# Patient Record
Sex: Male | Born: 1990 | Race: Black or African American | Hispanic: No | Marital: Married | Smoking: Never smoker
Health system: Southern US, Community
[De-identification: ages and names within clinical notes are randomized; demographics above are authoritative.]

---

## 2014-08-02 ENCOUNTER — Encounter (HOSPITAL_COMMUNITY): Payer: Self-pay | Admitting: Emergency Medicine

## 2014-08-02 ENCOUNTER — Emergency Department (HOSPITAL_COMMUNITY)
Admission: EM | Admit: 2014-08-02 | Discharge: 2014-08-02 | Disposition: A | Discharge status: Home / Self Care | Attending: Emergency Medicine | Admitting: Emergency Medicine

## 2014-08-02 DIAGNOSIS — R361 Hematospermia: Secondary | ICD-10-CM

## 2014-08-02 DIAGNOSIS — N4889 Other specified disorders of penis: Secondary | ICD-10-CM | POA: Diagnosis present

## 2014-08-02 DIAGNOSIS — R110 Nausea: Secondary | ICD-10-CM | POA: Insufficient documentation

## 2014-08-02 NOTE — ED Provider Notes (Signed)
CSN: [id number]     Arrival date & time 08/02/14  1900 History  This chart was scribed for non-physician practitioner, Roxy Horsemanobert Wen Munford, working with Azalia BilisKevin Campos, MD by Richarda Overlieichard Holland, ED Scribe. This patient was seen in room WTR8/WTR8 and the patient's care was started at 7:37 PM.   Chief Complaint  Patient presents with  . Penis Problem    HPI HPI Comments: Evan ShellingHansel Swarey is a 24 y.o. male who presents to the Emergency Department complaining of a sensation in his penis that he describes as a pressure that started approximately 2 hours ago. Pt states that he had sex earlier with his pregnant wife and noticed that he had blood in his urine after sex. Pt states that he does not want a STD screening at this time. He reports no exacerbating factors at this time. He denies any burning with urination, CVA tenderness, vomiting or rectal pain.   History reviewed. No pertinent past medical history. No past surgical history on file. No family history on file. History  Substance Use Topics  . Smoking status: Never Smoker   . Smokeless tobacco: Not on file  . Alcohol Use: No    Review of Systems  Gastrointestinal: Positive for nausea. Negative for vomiting and rectal pain.  Genitourinary: Positive for penile pain ( pressure). Negative for dysuria, flank pain and difficulty urinating.      Allergies  Review of patient's allergies indicates no known allergies.  Home Medications   Prior to Admission medications   Not on File   BP 143/68 mmHg  Pulse 57  Temp(Src) 98.5 F (36.9 C) (Oral)  Resp 16  SpO2 100% Physical Exam  Constitutional: He is oriented to person, place, and time. He appears well-developed and well-nourished.  HENT:  Head: Normocephalic and atraumatic.  Eyes: Right eye exhibits no discharge. Left eye exhibits no discharge.  Neck: Neck supple. No tracheal deviation present.  Cardiovascular: Normal rate.   Pulmonary/Chest: Effort normal. No respiratory distress.   Abdominal: He exhibits no distension.  Genitourinary: Penis normal.  Uncircumcised male, no masses, lesions, or other abnormality about the penis, scrotum, testes  Chaperone present during exam  Neurological: He is alert and oriented to person, place, and time.  Skin: Skin is warm and dry.  Psychiatric: He has a normal mood and affect. His behavior is normal.  Nursing note and vitals reviewed.   ED Course  Procedures   DIAGNOSTIC STUDIES: Oxygen Saturation is 100% on RA, normal by my interpretation.    COORDINATION OF CARE: 7:42 PM Discussed treatment plan with pt at bedside and pt agreed to plan.  Labs Review Labs Reviewed - No data to display  Imaging Review No results found.   EKG Interpretation None      MDM   Final diagnoses:  Hematospermia   Patient with episode of Hematospermia.  Patient reassured. Instructed to follow-up with urology if symptoms persist. Denies any dysuria. There are no aggravating or relieving factors. Denies any pain in his rectum. Denies any other penile discharge. States that he is monogamous.  I personally performed the services described in this documentation, which was scribed in my presence. The recorded information has been reviewed and is accurate.      Roxy Horsemanobert Versie Soave, PA-C 08/02/14 1955  Azalia BilisKevin Campos, MD 08/03/14 [phone]

## 2014-08-02 NOTE — ED Notes (Signed)
Pt BIB by very pregnant wife.  States that they were "having one last rendevous" to try and get his wife to go into labor.  States that the sex was very slow and not vigorous.  Wife states that she gave him oral first and did not notice any blood when she was going down on him.  Noticed some blood when they finished coitus.

## 2014-08-02 NOTE — Discharge Instructions (Signed)

## 2016-01-13 ENCOUNTER — Emergency Department (HOSPITAL_BASED_OUTPATIENT_CLINIC_OR_DEPARTMENT_OTHER)
Admission: EM | Admit: 2016-01-13 | Discharge: 2016-01-13 | Disposition: A | Discharge status: Home / Self Care | Attending: Emergency Medicine | Admitting: Emergency Medicine

## 2016-01-13 ENCOUNTER — Encounter (HOSPITAL_BASED_OUTPATIENT_CLINIC_OR_DEPARTMENT_OTHER): Payer: Self-pay | Admitting: Emergency Medicine

## 2016-01-13 DIAGNOSIS — R21 Rash and other nonspecific skin eruption: Secondary | ICD-10-CM | POA: Diagnosis present

## 2016-01-13 DIAGNOSIS — L509 Urticaria, unspecified: Secondary | ICD-10-CM | POA: Diagnosis not present

## 2016-01-13 MED ORDER — PREDNISONE 50 MG PO TABS
60.0000 mg | ORAL_TABLET | Freq: Once | ORAL | Status: AC
  Administered 2016-01-13: 60 mg via ORAL
  Filled 2016-01-13: qty 1

## 2016-01-13 MED ORDER — PREDNISONE 20 MG PO TABS: ORAL_TABLET | ORAL | 0 refills | Status: DC

## 2016-01-13 NOTE — ED Triage Notes (Addendum)
Patient reports l carnitine last night.  Reports hives began after taking this last night.  Reports pruritic generalized rash.  States he took 20mL of benadryl around 10am.  Denies any shortness of breath, difficulty swallowing.  No rash visualized at present.

## 2016-01-13 NOTE — ED Provider Notes (Signed)
[facility] Provider Note   CSN: [id number] Arrival date & time: 01/13/16  1707  By signing my name below, I, Rosario AdieWilliam Andrew Hiatt, attest that this documentation has been prepared under the direction and in the presence of Fayrene HelperBowie Jamason Peckham, PA-C.  Electronically Signed: Rosario AdieWilliam Andrew Hiatt, ED Scribe. 01/13/16. 5:52 PM.  History   Chief Complaint Chief Complaint  Patient presents with  . Allergic Reaction   The history is provided by the patient. No language interpreter was used.    HPI Comments: Evan Severinimothy Martinez is a 25 y.o. male with no pertinent PMHX, who presents to the Emergency Department complaining of diffuse, pruritic rash which began last night. He notes associated sensation of facial swelling secondary to his rash as well. Pt states that he recently d began a new workout routine, and started a new dietary supplement regimen which he has never used before prior to the onset of his rash. No new soaps, lotions, detergents, foods, animals, plants, or medications otherwise. No h/o similar symptoms. Pt took 20mL of Benadryl approximately 8 hours ago with moderate relief of his symptoms. Denies SOB, abdominal pain, lightheadedness, dizziness, sensation of throat/tongue swelling, or any other associated symptoms.   History reviewed. No pertinent past medical history.  There are no active problems to display for this patient.  History reviewed. No pertinent surgical history.  Home Medications    Prior to Admission medications   Not on File   Family History History reviewed. No pertinent family history.  Social History Social History  Substance Use Topics  . Smoking status: Never Smoker  . Smokeless tobacco: Never Used  . Alcohol use No   Allergies   Patient has no known allergies.  Review of Systems Review of Systems  HENT: Positive for facial swelling. Negative for trouble swallowing.   Gastrointestinal: Negative for abdominal pain.  Skin: Positive for  rash.  Neurological: Negative for dizziness and light-headedness.   Physical Exam Updated Vital Signs BP 139/82 (BP Location: Left Arm)   Pulse 69   Temp 98.3 F (36.8 C) (Oral)   Resp 18   Ht 6\' 2"  (1.88 m)   Wt 210 lb (95.3 kg)   SpO2 100%   BMI 26.96 kg/m   Physical Exam  Constitutional: He appears well-developed and well-nourished. No distress.  HENT:  Head: Normocephalic and atraumatic.  Right Ear: External ear normal.  Left Ear: External ear normal.  Nose: Nose normal.  Mouth/Throat: Oropharynx is clear and moist. No oropharyngeal exudate.  Eyes: Conjunctivae are normal.  Neck: Normal range of motion.  Cardiovascular: Normal rate, regular rhythm and normal heart sounds.   No murmur heard. Pulmonary/Chest: Effort normal and breath sounds normal. No respiratory distress. He has no wheezes. He has no rales.  Abdominal: Soft. He exhibits no distension. There is no tenderness.  Musculoskeletal: Normal range of motion.  Neurological: He is alert.  Skin: No pallor.  Faint urticarial rash throughout trunk of body w/o signs of infection.   Psychiatric: He has a normal mood and affect. His behavior is normal.  Nursing note and vitals reviewed.  ED Treatments / Results  DIAGNOSTIC STUDIES: Oxygen Saturation is 100% on RA, normal by my interpretation.   COORDINATION OF CARE: 5:51 PM-Discussed next steps with pt. Pt verbalized understanding and is agreeable with the plan.   Procedures Procedures   Medications Ordered in ED Medications - No data to display  Initial Impression / Assessment and Plan / ED Course  I have reviewed the triage  vital signs and the nursing notes.  Pertinent labs & imaging results that were available during my care of the patient were reviewed by me and considered in my medical decision making (see chart for details).  Clinical Course    Patient re-evaluated prior to dc, is hemodynamically stable, in no respiratory distress, and denies the  feeling of throat closing. Pt has been advised to take OTC benadryl, and will rx short course of Prednisone. Advised to return to the ED if they have a mod-severe allergic rxn (s/s including throat closing, difficulty breathing, swelling of lips face or tongue). Pt is comfortable with above plan and is stable for discharge at this time. All questions were answered prior to disposition. Strict return precautions for return into the ED were discussed.   Final Clinical Impressions(s) / ED Diagnoses   Final diagnoses:  Urticaria   New Prescriptions New Prescriptions   PREDNISONE (DELTASONE) 20 MG TABLET    3 tabs po day one, then 2 tabs daily x 4 days   I personally performed the services described in this documentation, which was scribed in my presence. The recorded information has been reviewed and is accurate.       Fayrene HelperBowie Layman Gully, PA-C 01/13/16 1813    Alvira MondayErin Schlossman, MD 01/14/16 2138

## 2016-01-13 NOTE — ED Notes (Addendum)
Pt reports he is took new supplement yesterday and he started having rash and  face swelling after he took it.  Pt reports he took benadryl last night and this morning with no relief.  Pt denies nausea, vomiting, diarrhea and fever.  Pt denies any problems swallowing.  Pt is alert and NAD.  Pt has facial swelling, lips swollen and rash on arms back and chest.  Pt is not SOB.

## 2016-12-23 ENCOUNTER — Emergency Department (HOSPITAL_BASED_OUTPATIENT_CLINIC_OR_DEPARTMENT_OTHER)

## 2016-12-23 ENCOUNTER — Emergency Department (HOSPITAL_COMMUNITY)
Admission: EM | Admit: 2016-12-23 | Discharge: 2016-12-23 | Disposition: A | Discharge status: Home / Self Care | Attending: Emergency Medicine | Admitting: Emergency Medicine

## 2016-12-23 ENCOUNTER — Encounter (HOSPITAL_BASED_OUTPATIENT_CLINIC_OR_DEPARTMENT_OTHER): Payer: Self-pay

## 2016-12-23 ENCOUNTER — Encounter (HOSPITAL_COMMUNITY): Payer: Self-pay

## 2016-12-23 ENCOUNTER — Emergency Department (HOSPITAL_BASED_OUTPATIENT_CLINIC_OR_DEPARTMENT_OTHER)
Admission: EM | Admit: 2016-12-23 | Discharge: 2016-12-23 | Disposition: A | Discharge status: Home / Self Care | Attending: Emergency Medicine | Admitting: Emergency Medicine

## 2016-12-23 DIAGNOSIS — R1031 Right lower quadrant pain: Secondary | ICD-10-CM | POA: Insufficient documentation

## 2016-12-23 DIAGNOSIS — Y939 Activity, unspecified: Secondary | ICD-10-CM | POA: Insufficient documentation

## 2016-12-23 DIAGNOSIS — S80211A Abrasion, right knee, initial encounter: Secondary | ICD-10-CM | POA: Diagnosis not present

## 2016-12-23 DIAGNOSIS — S0990XA Unspecified injury of head, initial encounter: Secondary | ICD-10-CM | POA: Diagnosis not present

## 2016-12-23 DIAGNOSIS — S60511A Abrasion of right hand, initial encounter: Secondary | ICD-10-CM | POA: Insufficient documentation

## 2016-12-23 DIAGNOSIS — Y92410 Unspecified street and highway as the place of occurrence of the external cause: Secondary | ICD-10-CM | POA: Diagnosis not present

## 2016-12-23 DIAGNOSIS — Z5321 Procedure and treatment not carried out due to patient leaving prior to being seen by health care provider: Secondary | ICD-10-CM | POA: Insufficient documentation

## 2016-12-23 DIAGNOSIS — Y999 Unspecified external cause status: Secondary | ICD-10-CM | POA: Insufficient documentation

## 2016-12-23 DIAGNOSIS — Z23 Encounter for immunization: Secondary | ICD-10-CM | POA: Insufficient documentation

## 2016-12-23 DIAGNOSIS — T07XXXA Unspecified multiple injuries, initial encounter: Secondary | ICD-10-CM

## 2016-12-23 DIAGNOSIS — R51 Headache: Secondary | ICD-10-CM | POA: Diagnosis present

## 2016-12-23 MED ORDER — ONDANSETRON 8 MG PO TBDP
8.0000 mg | ORAL_TABLET | Freq: Once | ORAL | Status: AC
  Administered 2016-12-23: 8 mg via ORAL
  Filled 2016-12-23: qty 1

## 2016-12-23 MED ORDER — TETANUS-DIPHTH-ACELL PERTUSSIS 5-2.5-18.5 LF-MCG/0.5 IM SUSP
INTRAMUSCULAR | Status: AC
  Filled 2016-12-23: qty 0.5

## 2016-12-23 MED ORDER — OXYCODONE-ACETAMINOPHEN 5-325 MG PO TABS
1.0000 | ORAL_TABLET | Freq: Once | ORAL | Status: AC
  Administered 2016-12-23: 1 via ORAL
  Filled 2016-12-23: qty 1

## 2016-12-23 MED ORDER — TETANUS-DIPHTH-ACELL PERTUSSIS 5-2.5-18.5 LF-MCG/0.5 IM SUSP
0.5000 mL | Freq: Once | INTRAMUSCULAR | Status: AC
  Administered 2016-12-23: 0.5 mL via INTRAMUSCULAR

## 2016-12-23 MED ORDER — KETOROLAC TROMETHAMINE 60 MG/2ML IM SOLN
60.0000 mg | Freq: Once | INTRAMUSCULAR | Status: AC
  Administered 2016-12-23: 60 mg via INTRAMUSCULAR
  Filled 2016-12-23: qty 2

## 2016-12-23 MED ORDER — ONDANSETRON 8 MG PO TBDP: 8.0000 mg | ORAL_TABLET | Freq: Three times a day (TID) | ORAL | 0 refills | Status: DC | PRN

## 2016-12-23 MED FILL — ONDANSETRON ODT 8 MG TABLET: 8 | 4 days supply | Qty: 10 | Fill #0

## 2016-12-23 NOTE — ED Notes (Signed)
ED Provider at bedside. 

## 2016-12-23 NOTE — ED Notes (Signed)
Patient transported to CT 
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
## 2016-12-23 NOTE — ED Triage Notes (Signed)
This Rn spoke with pts wife and discussed the triage process and encouraged the pt to wait to be seen, pt previously updated pt is 28 on list to be seen, pt encouraged to stay at fast track patient with 5 ahead to be seen, pts family remains agitated and requesting to be discharged to be LWBS after triage
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
## 2016-12-23 NOTE — Discharge Instructions (Signed)
Ibuprofen and/or tylenol for headache. Rest. Bacitracin twice a day topically to abrasions. Zofran for nausea and vomiting as needed. Follow up with primary care doctor or concussion clinic as referred.
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
## 2017-01-28 ENCOUNTER — Encounter (HOSPITAL_BASED_OUTPATIENT_CLINIC_OR_DEPARTMENT_OTHER): Payer: Self-pay

## 2017-01-28 ENCOUNTER — Emergency Department (HOSPITAL_BASED_OUTPATIENT_CLINIC_OR_DEPARTMENT_OTHER)
Admission: EM | Admit: 2017-01-28 | Discharge: 2017-01-28 | Disposition: A | Discharge status: Home / Self Care | Attending: Emergency Medicine | Admitting: Emergency Medicine

## 2017-01-28 ENCOUNTER — Other Ambulatory Visit: Payer: Self-pay

## 2017-01-28 ENCOUNTER — Emergency Department (HOSPITAL_BASED_OUTPATIENT_CLINIC_OR_DEPARTMENT_OTHER)

## 2017-01-28 DIAGNOSIS — Y999 Unspecified external cause status: Secondary | ICD-10-CM | POA: Diagnosis not present

## 2017-01-28 DIAGNOSIS — M79645 Pain in left finger(s): Secondary | ICD-10-CM | POA: Insufficient documentation

## 2017-01-28 DIAGNOSIS — W268XXA Contact with other sharp object(s), not elsewhere classified, initial encounter: Secondary | ICD-10-CM | POA: Insufficient documentation

## 2017-01-28 DIAGNOSIS — Y939 Activity, unspecified: Secondary | ICD-10-CM | POA: Diagnosis not present

## 2017-01-28 DIAGNOSIS — Y929 Unspecified place or not applicable: Secondary | ICD-10-CM | POA: Insufficient documentation

## 2017-01-28 MED ORDER — BUPIVACAINE HCL 0.5 % IJ SOLN
50.0000 mL | Freq: Once | INTRAMUSCULAR | Status: AC
  Administered 2017-01-28: 50 mL
  Filled 2017-01-28: qty 1

## 2017-01-28 MED ORDER — CEPHALEXIN 250 MG PO CAPS
500.0000 mg | ORAL_CAPSULE | Freq: Once | ORAL | Status: AC
  Administered 2017-01-28: 500 mg via ORAL
  Filled 2017-01-28: qty 2

## 2017-01-28 MED ORDER — CEPHALEXIN 500 MG PO CAPS: 500.0000 mg | ORAL_CAPSULE | Freq: Three times a day (TID) | ORAL | 0 refills | Status: DC

## 2017-01-28 NOTE — ED Notes (Signed)
Patient claimed that he is not in pain d/t his finger being numbed.  Splint intact.

## 2017-01-28 NOTE — Discharge Instructions (Signed)
Keep the splint on.  Warm soapy water soaks several times a day.  Take antibiotic as prescribed.  Take ibuprofen or Tylenol for pain.  If not improving or worsening in 48 hours, return to emergency department for reevaluation.  Return sooner if worsening redness, swelling, weakness, numbness to the finger.

## 2017-01-28 NOTE — ED Triage Notes (Signed)
Pt reports pain to left ring finger while working today. Possible ?splinter
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
## 2017-01-28 NOTE — ED Provider Notes (Signed)
[facility] EMERGENCY DEPARTMENT Provider Note   CSN: [id number] Arrival date & time: 01/28/17  1940     History   Chief Complaint Chief Complaint  Patient presents with  . Hand Pain    HPI Evan Severinimothy Secrest is a 26 y.o. male.  HPI Evan Severinimothy Lavigne is a 26 y.o. male presents to emergency department complaining of pain to the left ring finger.  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  He states he got a splinter in his proximal left ring finger.  He states he pulled the splinter out.  He states it is quite long, describes it as being approximately inch and a half.  He states since then he has not been able to lift anything with that finger, and states it is very painful to flex and extend it all the way.  States she is not sure if there is any more splinter in the finger or if he damaged a nerve or tendon.  He states his tetanus is up-to-date.  He denies any other injuries to the finger or his hand.  Denies any numbness distally.  States finger is very swollen.  History reviewed. No pertinent past medical history.  There are no active problems to display for this patient.   History reviewed. No pertinent surgical history.     Home Medications    Prior to Admission medications   Medication Sig Start Date End Date Taking? Authorizing Provider  ondansetron (ZOFRAN ODT) 8 MG disintegrating tablet Take 1 tablet (8 mg total) by mouth every 8 (eight) hours as needed for nausea or vomiting. 12/23/16   Jaynie CrumbleKirichenko, Wanya Bangura, PA-C    Family History No family history on file.  Social History Social History   Tobacco Use  . Smoking status: Never Smoker  . Smokeless tobacco: Never Used  Substance Use Topics  . Alcohol use: No  . Drug use: No     Allergies   Patient has no known allergies.   Review of Systems Review of Systems  Constitutional: Negative for chills and fever.  Respiratory: Negative for cough, chest tightness and shortness of breath.    Cardiovascular: Negative for chest pain, palpitations and leg swelling.  Musculoskeletal: Positive for arthralgias and myalgias. Negative for neck pain and neck stiffness.  Skin: Negative for rash.  Allergic/Immunologic: Negative for immunocompromised state.  Neurological: Negative for dizziness, light-headedness, numbness and headaches.  All other systems reviewed and are negative.    Physical Exam Updated Vital Signs BP (!) 154/75 (BP Location: Left Arm)   Pulse 65   Temp 98.5 F (36.9 C) (Oral)   Resp 16   Ht 6\' 3"  (1.905 m)   Wt 104.3 kg (230 lb)   SpO2 100%   BMI 28.75 kg/m   Physical Exam  Constitutional: He appears well-developed and well-nourished. No distress.  Eyes: Conjunctivae are normal.  Neck: Neck supple.  Cardiovascular: Normal rate.  Pulmonary/Chest: No respiratory distress.  Abdominal: He exhibits no distension.  Musculoskeletal:  Significant swelling to the palmar surface of the proximal left ring finger.  There is a tiny, pinpoint puncture wound just distal to the PIP joint over palmar surface.  Finger is very tender over palmar surface of the MCP joint, PIP joints, and proximal phalanx.  Unable to fully extend both of those joints.  Pain with any flexion of the joints.  Capillary refill less than 2 seconds distally.  Sensation is intact distally  Skin: Skin is warm and dry.  Nursing note  and vitals reviewed.    ED Treatments / Results  Labs (all labs ordered are listed, but only abnormal results are displayed) Labs Reviewed - No data to display  EKG  EKG Interpretation None       Radiology Dg Finger Ring Left  Result Date: 01/28/2017 CLINICAL DATA:  Recent splinter EXAM: LEFT FOURTH FINGER 2+V COMPARISON:  None. FINDINGS: Frontal, oblique, and lateral views were obtained. No evident fracture or dislocation. There is no evident radiopaque foreign body. No joint space narrowing or erosion. IMPRESSION: No radiopaque foreign body or soft tissue  air is evident on this study. No fracture or dislocation. No evident arthropathy. Electronically Signed   By: Bretta BangWilliam  Woodruff III M.D.   On: 01/28/2017 20:32    Procedures Procedures (including critical care time)  Medications Ordered in ED Medications - No data to display   Initial Impression / Assessment and Plan / ED Course  I have reviewed the triage vital signs and the nursing notes.  Pertinent labs & imaging results that were available during my care of the patient were reviewed by me and considered in my medical decision making (see chart for details).     Patient in the emergency department with pain, swelling, difficulty flexing and extending left ring finger after a splinter.  Patient states that he is pretty sure he removed most of the splinter.  X-ray obtained to rule out a foreign body and is negative.  On exam, patient initially refusing to flex or extend finger at PIP joint due to pain.  Nerve block performed.  I tried to explore the wound, did not feel or can palpate a foreign body.  I did not open up the puncture wound.  Patient now can flex his finger fairly well, however extension is still painful despite good anesthesia.  Discussed patient with Dr. Fayrene FearingJames, who has seen the patient.  Question whether this bladder could have damaged the tendon or even a joint capsule which would cause him to have the severe pain.  Also question whether there could be retained foreign body.  We will plan placing him in the splint.  Will start on antibiotic to prevent infection.  Discussed careful return precautions which includes drainage, swelling, redness.  Otherwise follow-up in 48 hours for wound recheck and reassessment.  If continues to have difficulty flexing and extending finger, he will need referral to hand surgery. Pt agrees to the plan.   Vitals:   01/28/17 1954 01/28/17 1955 01/28/17 2138  BP: (!) 154/75  (!) 143/81  Pulse: 65  (!) 58  Resp: 16  20  Temp: 98.5 F (36.9 C)  98  F (36.7 C)  TempSrc: Oral  Oral  SpO2: 100%  100%  Weight:  104.3 kg (230 lb)   Height:  6\' 3"  (1.905 m)      Final Clinical Impressions(s) / ED Diagnoses   Final diagnoses:  Finger pain, left    ED Discharge Orders    None       Iona CoachKirichenko, Kyrene Longan, PA-C 01/28/17 2148    Rolland PorterJames, Mark, MD 02/04/17 1524
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
## 2018-06-05 IMAGING — CR DG FINGER RING 2+V*L*
3 series · 3 of 3 positions shown · non-contrast
Comparison: None.

CLINICAL DATA: Recent splinter

EXAM:
LEFT FOURTH FINGER 2+V

[x finger pa left]
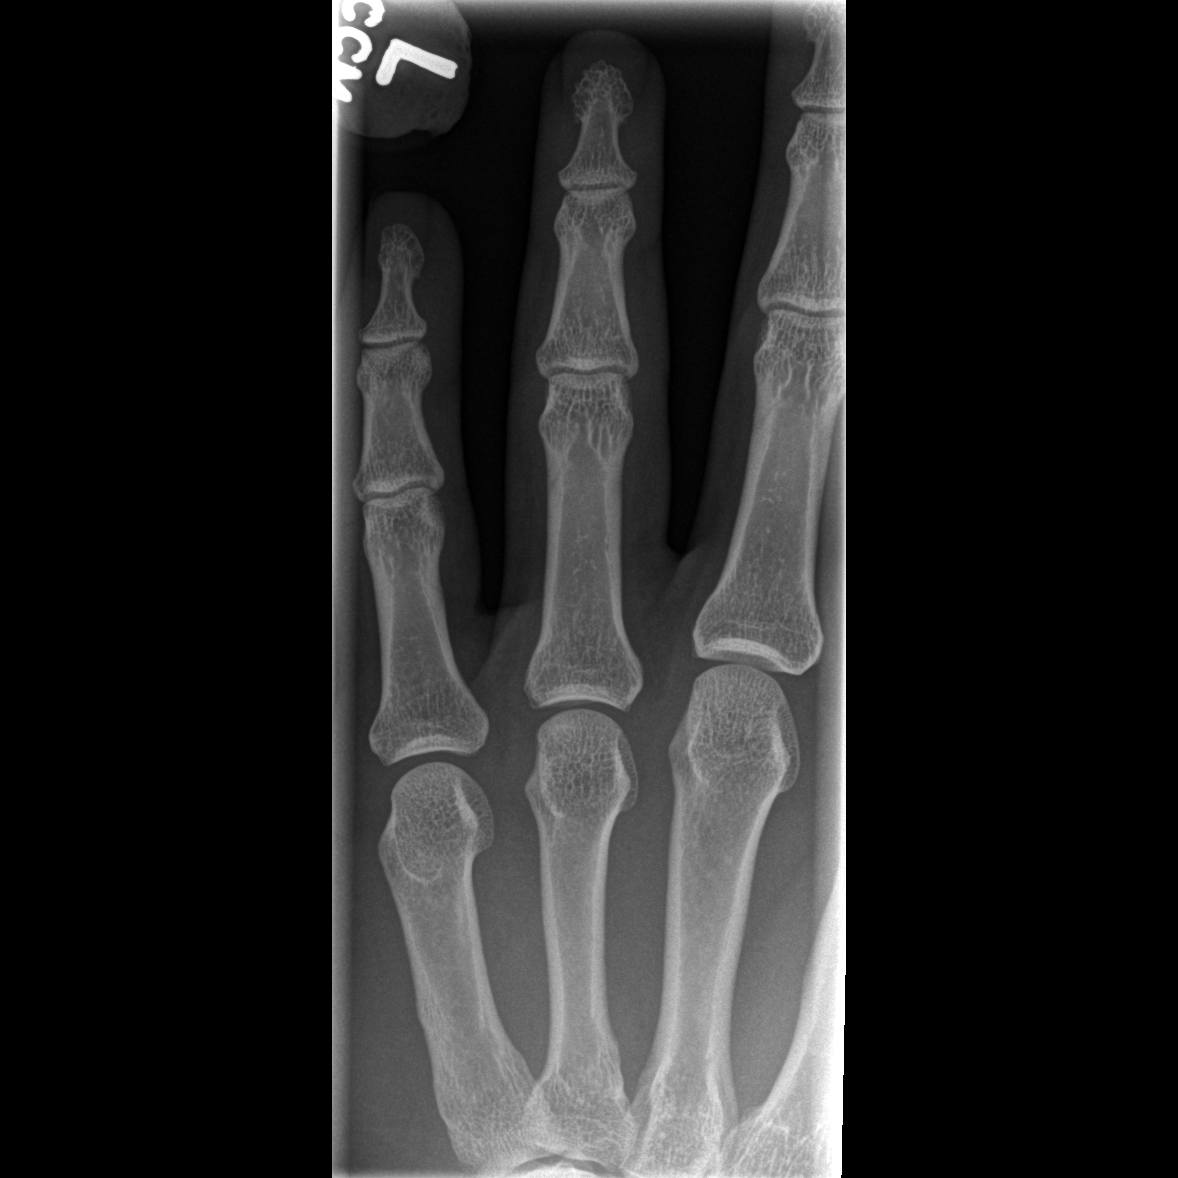

[x finger obl. left]
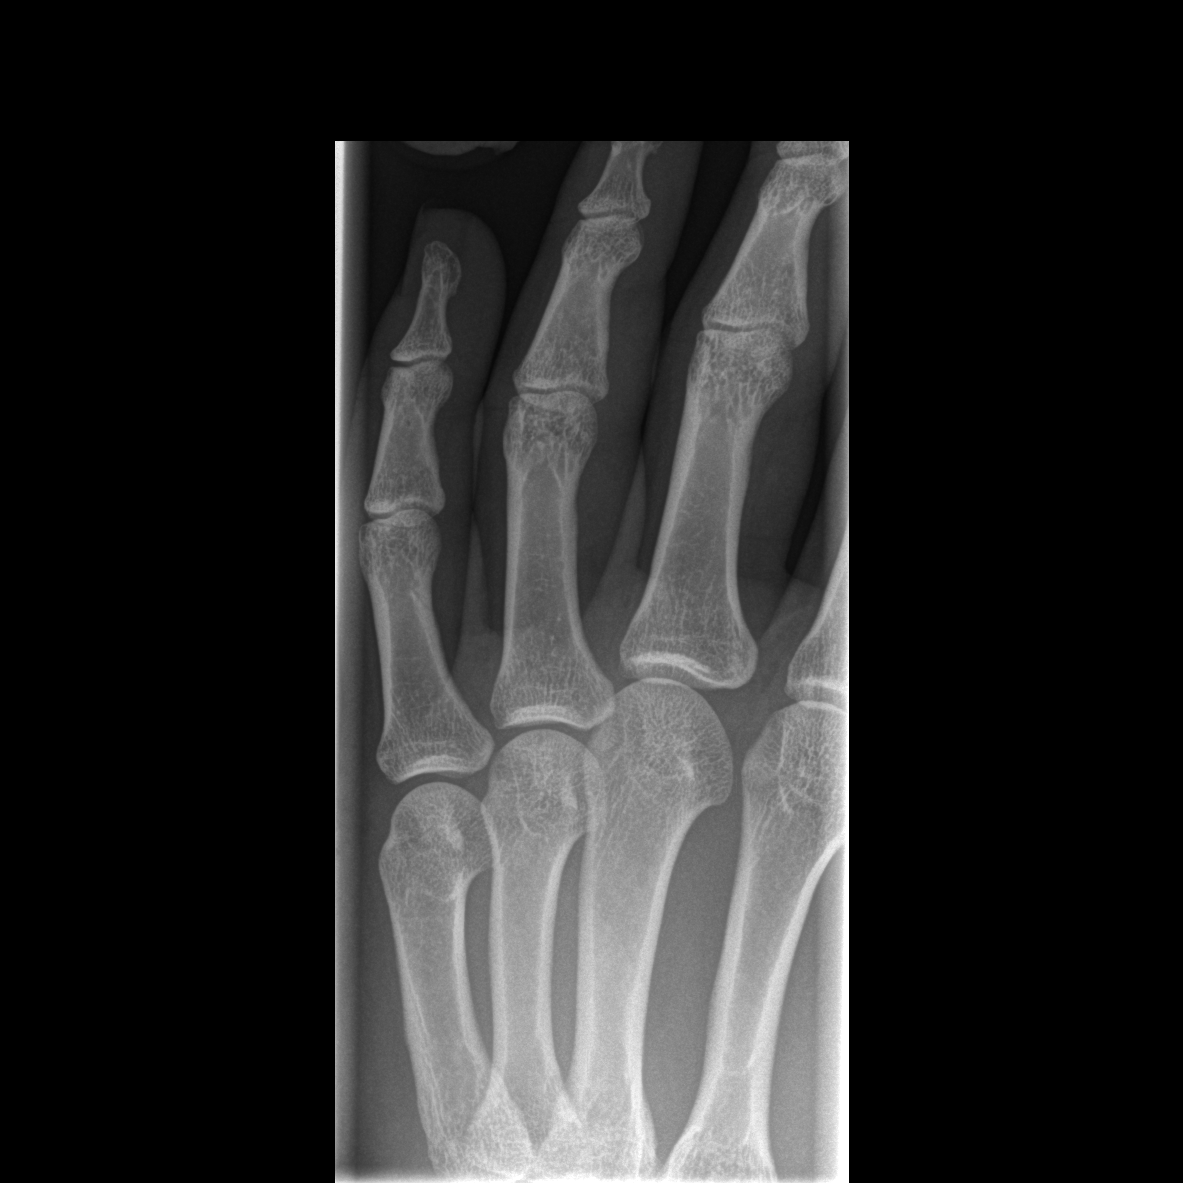

[x finger lateral left]
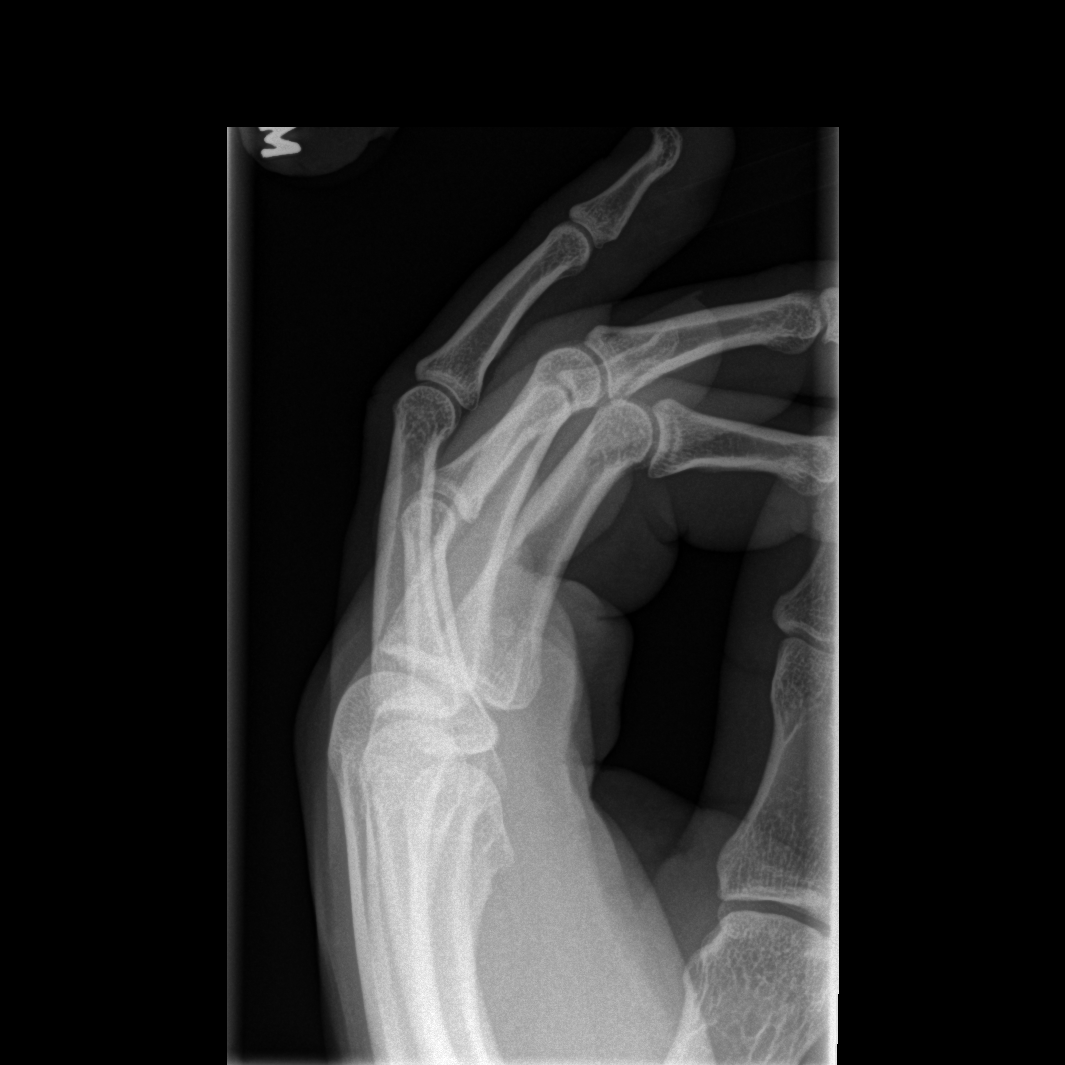

[3 of 3 positions shown; findings below may reference images not displayed]

FINDINGS: Frontal, oblique, and lateral views were obtained. No evident
fracture or dislocation. There is no evident radiopaque foreign
body. No joint space narrowing or erosion.
IMPRESSION: No radiopaque foreign body or soft tissue air is evident on this
study. No fracture or dislocation. No evident arthropathy.

## 2019-08-30 ENCOUNTER — Encounter (HOSPITAL_BASED_OUTPATIENT_CLINIC_OR_DEPARTMENT_OTHER): Payer: Self-pay

## 2019-08-30 ENCOUNTER — Emergency Department (HOSPITAL_BASED_OUTPATIENT_CLINIC_OR_DEPARTMENT_OTHER)

## 2019-08-30 ENCOUNTER — Other Ambulatory Visit: Payer: Self-pay

## 2019-08-30 ENCOUNTER — Emergency Department (HOSPITAL_BASED_OUTPATIENT_CLINIC_OR_DEPARTMENT_OTHER)
Admission: EM | Admit: 2019-08-30 | Discharge: 2019-08-31 | Disposition: A | Discharge status: Home / Self Care | Attending: Emergency Medicine | Admitting: Emergency Medicine

## 2019-08-30 DIAGNOSIS — S0181XA Laceration without foreign body of other part of head, initial encounter: Secondary | ICD-10-CM

## 2019-08-30 DIAGNOSIS — S0993XA Unspecified injury of face, initial encounter: Secondary | ICD-10-CM | POA: Diagnosis present

## 2019-08-30 DIAGNOSIS — Y9389 Activity, other specified: Secondary | ICD-10-CM | POA: Insufficient documentation

## 2019-08-30 DIAGNOSIS — Y9229 Other specified public building as the place of occurrence of the external cause: Secondary | ICD-10-CM | POA: Diagnosis not present

## 2019-08-30 DIAGNOSIS — W228XXA Striking against or struck by other objects, initial encounter: Secondary | ICD-10-CM | POA: Diagnosis not present

## 2019-08-30 DIAGNOSIS — Y990 Civilian activity done for income or pay: Secondary | ICD-10-CM | POA: Insufficient documentation

## 2019-08-30 MED ORDER — KETOROLAC TROMETHAMINE 30 MG/ML IJ SOLN
15.0000 mg | Freq: Once | INTRAMUSCULAR | Status: DC
  Filled 2019-08-30: qty 1

## 2019-08-30 MED ORDER — LIDOCAINE-EPINEPHRINE 1 %-1:100000 IJ SOLN
INTRAMUSCULAR | Status: AC
  Filled 2019-08-30: qty 1

## 2019-08-30 MED ORDER — LIDOCAINE-EPINEPHRINE (PF) 2 %-1:200000 IJ SOLN
10.0000 mL | Freq: Once | INTRAMUSCULAR | Status: AC
  Administered 2019-08-30: 10 mL

## 2019-08-30 MED ORDER — KETOROLAC TROMETHAMINE 60 MG/2ML IM SOLN
30.0000 mg | Freq: Once | INTRAMUSCULAR | Status: AC
  Administered 2019-08-30: 30 mg via INTRAMUSCULAR

## 2019-08-30 NOTE — Discharge Instructions (Addendum)
You have been seen today for a facial injury. There were no acute abnormalities on the x-rays, including no sign of fracture or dislocation, however, there could be injuries to the soft tissues, such as the ligaments or tendons that are not seen on xrays. There could also be what are called occult fractures that are small fractures not seen on xray.  Hydration and diet: Please make an extra effort to hydrate adequately by drinking plenty of water.  This means drinking at least 64 ounces of water a day at the very minimum. You may need to try a progressive diet of liquids, soups, smoothies, etc. and then advance to softer solid foods. Antiinflammatory medications: Take 600 mg of ibuprofen every 6 hours or 440 mg (over the counter dose) to 500 mg (prescription dose) of naproxen every 12 hours for the next 3 days. After this time, these medications may be used as needed for pain. Take these medications with food to avoid upset stomach. Choose only one of these medications, do not take them together. Acetaminophen (generic for Tylenol): Should you continue to have additional pain while taking the ibuprofen or naproxen, you may add in acetaminophen as needed. Your daily total maximum amount of acetaminophen from all sources should be limited to 4000mg /day for persons without liver problems, or 2000mg /day for those with liver problems. Vicodin: May take Vicodin (hydrocodone-acetaminophen) as needed for severe pain.   Do not drive or perform other dangerous activities while taking this medication as it can cause drowsiness as well as changes in reaction time and judgement.   Please note that each pill of Vicodin contains 325 mg of acetaminophen (generic for Tylenol) and the above dosage limits apply. Ice: May apply ice to the area over the next 24 hours for 15 minutes at a time to reduce swelling. Elevation: Keep the head elevated by sitting up in a chair or propped up on pillows in bed.  This will reduce  swelling and therefore pain. Follow up: Follow-up with the ear nose and throat (ENT) specialist for any further management of this issue.  Call to make an appointment. Return: Return to the ED for difficulty breathing, difficulty swallowing, significantly worsening pain, or any other major concerns.  For prescription assistance, may try using prescription discount sites or apps, such as goodrx.com or Good Rx smart phone app.  Wound Care - Dermabond  Your wound has been closed with a medical-grade glue called Dermabond. Please adhere to the following wound care instructions:  You may shower, but avoid submerging the wound. Do not scrub the wound, as this may cause the glue to wear off prematurely.    The glue will wear off on its own, usually within 5-10 days. During this time period DO NOT apply antibiotic ointments or any other ointments or lotions to the area as this can cause the glue to wear off prematurely.  After 10 days, you may apply ointments, such as Neosporin, to the area to help the remaining glue to wear off.   After the wound has healed and the glue is gone, you may apply ointments such as Aquaphor to the wound to reduce scarring.  May use ibuprofen, naproxen, or Tylenol for pain.  Return to the ED should the wound edges come apart or signs of infection arise, such as spreading redness, puffiness/swelling, pus draining from the wound, severe increase in pain, or any other major issues.  For prescription assistance, may try using prescription discount sites or apps, such as goodrx.com

## 2019-08-30 NOTE — ED Provider Notes (Signed)
[facility] EMERGENCY DEPARTMENT Provider Note   CSN: [id number] Arrival date & time: 08/30/19  1859     History Chief Complaint  Patient presents with   Facial Laceration    Evan Martinez is a 29 y.o. male.  HPI      Evan Martinez is a 29 y.o. male, patient with no pertinent past medical history, presenting to the ED with facial injury that occurred around 5 PM this evening. Patient is a IT sales professional.  He was using a hooklike tool trying to lift and pry an object, the tool slipped, and the back of the metal tool hit him in the face and chin. He endorses wounds to the chin as well as pain and swelling along the jaw bilaterally.  Pain is severe, throbbing, radiating along the jaw. Tetanus up-to-date within the last 5 years.  Denies LOC, dizziness, headache, neck/back pain, subsequent fall, difficulty breathing, difficulty swallowing, neurologic deficits, or any other complaints.  History reviewed. No pertinent past medical history.  There are no problems to display for this patient.   History reviewed. No pertinent surgical history.     No family history on file.  Social History   Tobacco Use   Smoking status: Never Smoker   Smokeless tobacco: Never Used  Vaping Use   Vaping Use: Never used  Substance Use Topics   Alcohol use: Yes    Comment: occ   Drug use: No    Home Medications Prior to Admission medications   Medication Sig Start Date End Date Taking? Authorizing Provider  HYDROcodone-acetaminophen (NORCO/VICODIN) 5-325 MG tablet Take 1-2 tablets by mouth every 6 (six) hours as needed for severe pain. 08/31/19   Aedon Deason C, PA-C    Allergies    Banana and Onion  Review of Systems   Review of Systems  Constitutional: Negative for diaphoresis.  HENT: Positive for facial swelling. Negative for drooling, sore throat and trouble swallowing.   Gastrointestinal: Negative for nausea and vomiting.  Musculoskeletal: Negative for back pain,  neck pain and neck stiffness.  Neurological: Negative for dizziness, syncope, weakness, numbness and headaches.  All other systems reviewed and are negative.   Physical Exam Updated Vital Signs BP (!) 142/87 (BP Location: Left Arm)    Pulse 82    Temp 98.5 F (36.9 C) (Oral)    Resp 16    Ht 6\' 3"  (1.905 m)    Wt 113.1 kg    SpO2 100%    BMI 31.17 kg/m   Physical Exam Vitals and nursing note reviewed.  Constitutional:      General: He is not in acute distress.    Appearance: He is well-developed. He is not diaphoretic.  HENT:     Head: Normocephalic.     Comments: Three lacerations, the largest of which appears to be about 2 cm, to the right inferior mandible. The other two lacerations measuring 0.5 cm each.    Mouth/Throat:     Mouth: Mucous membranes are moist.     Comments: Swelling and tenderness along the mandible bilaterally. No obvious deformity noted to the line of the mandible.  Patient has difficulty opening his mouth which she states is due to pain.  He can open it to about 1 finger width.  I did not see any fractured dentition, however, limited mouth opening complicated this assessment. Trismus type speech. Handles oral secretions without noted difficulty.  Patient in a semireclined position. Eyes:     Conjunctiva/sclera: Conjunctivae normal.  Cardiovascular:  Rate and Rhythm: Normal rate and regular rhythm.     Pulses: Normal pulses.          Radial pulses are 2+ on the right side and 2+ on the left side.  Pulmonary:     Effort: Pulmonary effort is normal. No respiratory distress.  Abdominal:     Tenderness: There is no guarding.  Musculoskeletal:     Cervical back: Normal range of motion and neck supple.     Comments: Normal motor function intact in all extremities. No midline spinal tenderness.   Skin:    General: Skin is warm and dry.  Neurological:     Mental Status: He is alert and oriented to person, place, and time.     Comments: No noted acute  cognitive deficit. Sensation grossly intact to light touch in the extremities.   Grip strengths equal bilaterally.   Strength 5/5 in all extremities.  No gait disturbance.  Coordination intact.  Cranial nerves III-XII grossly intact.  Handles oral secretions without noted difficulty.  No noted phonation or speech deficit. No facial droop.   Psychiatric:        Mood and Affect: Mood and affect normal.        Speech: Speech normal.        Behavior: Behavior normal.     ED Results / Procedures / Treatments   Labs (all labs ordered are listed, but only abnormal results are displayed) Labs Reviewed - No data to display  EKG None  Radiology No results found.  CT Maxillofacial Wo Contrast  Result Date: 08/30/2019 CLINICAL DATA:  Jaw injury EXAM: CT MAXILLOFACIAL WITHOUT CONTRAST TECHNIQUE: Multidetector CT imaging of the maxillofacial structures was performed. Multiplanar CT image reconstructions were also generated. COMPARISON:  None. FINDINGS: Osseous: No fracture or mandibular dislocation. No destructive process. Orbits: Negative. No traumatic or inflammatory finding. Sinuses: Clear. Soft tissues: Negative. Limited intracranial: No significant or unexpected finding. IMPRESSION: No CT evidence for acute facial bone fracture. Electronically Signed   By: Jasmine Pang M.D.   On: 08/30/2019 21:54    Procedures .Marland KitchenLaceration Repair  Date/Time: 08/30/2019 11:30 PM Performed by: Anselm Pancoast, PA-C Authorized by: Anselm Pancoast, PA-C   Consent:    Consent obtained:  Verbal   Consent given by:  Patient   Risks discussed:  Infection, need for additional repair, pain, poor cosmetic result and poor wound healing Anesthesia (see MAR for exact dosages):    Anesthesia method:  Local infiltration   Local anesthetic:  Lidocaine 1% WITH epi Laceration details:    Location:  Face   Face location:  Chin   Length (cm):  2 Repair type:    Repair type:  Intermediate Pre-procedure details:     Preparation:  Patient was prepped and draped in usual sterile fashion and imaging obtained to evaluate for foreign bodies Exploration:    Wound exploration: wound explored through full range of motion and entire depth of wound probed and visualized   Treatment:    Area cleansed with:  Saline and Betadine   Amount of cleaning:  Standard   Irrigation solution:  Sterile saline   Irrigation method:  Syringe Subcutaneous repair:    Suture size:  5-0   Suture material:  Fast-absorbing gut   Suture technique:  Figure eight   Number of sutures:  6 Skin repair:    Repair method:  Tissue adhesive Approximation:    Approximation:  Close Post-procedure details:    Dressing:  Open (no dressing)  Patient tolerance of procedure:  Tolerated well, no immediate complications  .Marland KitchenLaceration Repair  Date/Time: 08/30/2019 11:20 PM Performed by: Anselm Pancoast, PA-C Authorized by: Anselm Pancoast, PA-C   Consent:    Consent obtained:  Verbal   Consent given by:  Patient   Risks discussed:  Infection, need for additional repair, poor cosmetic result, poor wound healing and pain Anesthesia (see MAR for exact dosages):    Anesthesia method:  None Laceration details:    Location:  Face   Facial location: Skin just under right mandible.   Length (cm):  0.5 Repair type:    Repair type:  Simple Pre-procedure details:    Preparation:  Patient was prepped and draped in usual sterile fashion and imaging obtained to evaluate for foreign bodies Exploration:    Contaminated: no   Treatment:    Area cleansed with:  Betadine and saline   Amount of cleaning:  Standard   Irrigation solution:  Sterile saline   Irrigation method:  Syringe Skin repair:    Repair method:  Tissue adhesive Approximation:    Approximation:  Close Post-procedure details:    Dressing:  Open (no dressing)   Patient tolerance of procedure:  Tolerated well, no immediate complications .Marland KitchenLaceration Repair  Date/Time: 08/30/2019 11:25  PM Performed by: Anselm Pancoast, PA-C Authorized by: Anselm Pancoast, PA-C   Consent:    Consent obtained:  Verbal   Consent given by:  Patient   Risks discussed:  Infection, need for additional repair, poor cosmetic result, poor wound healing and pain Anesthesia (see MAR for exact dosages):    Anesthesia method:  None Laceration details:    Location:  Face   Facial location: Skin inferior right mandible.   Length (cm):  0.5 Repair type:    Repair type:  Simple Treatment:    Area cleansed with:  Saline   Amount of cleaning:  Standard   Irrigation solution:  Sterile saline   Irrigation method:  Syringe Skin repair:    Repair method:  Tissue adhesive Approximation:    Approximation:  Close Post-procedure details:    Dressing:  Open (no dressing)   Patient tolerance of procedure:  Tolerated well, no immediate complications   (including critical care time)  Medications Ordered in ED Medications  ketorolac (TORADOL) injection 30 mg (30 mg Intramuscular Given 08/30/19 2157)  lidocaine-EPINEPHrine (XYLOCAINE W/EPI) 2 %-1:200000 (PF) injection 10 mL (10 mLs Infiltration Given by Other 08/30/19 2244)    ED Course  I have reviewed the triage vital signs and the nursing notes.  Pertinent labs & imaging results that were available during my care of the patient were reviewed by me and considered in my medical decision making (see chart for details).  Clinical Course as of Sep 03 30  Tue Aug 30, 2019  2100 Declines analgesia.   [SJ]    Clinical Course User Index [SJ] Hye Trawick, Hillard Danker, PA-C   MDM Rules/Calculators/A&P                          Patient presents with a facial injury.  No noted airway compromise.  I personally reviewed and interpreted the patient's CT imaging.  No acute fracture or dislocation. After administration of Toradol, patient was able to move his mandible more freely, though painful.  We will have the patient follow-up with ENT. The patient was given instructions  for home care as well as return precautions. Patient voices understanding of these instructions,  accepts the plan, and is comfortable with discharge.   Findings and plan of care discussed with Vanetta MuldersScott Zackowski, MD.    Vitals:   08/30/19 1908 08/30/19 1909 08/30/19 2154 08/31/19 0038  BP: (!) 142/87  131/87 128/81  Pulse: 82  67 68  Resp: 16  19 16   Temp: 98.5 F (36.9 C)   98.5 F (36.9 C)  TempSrc: Oral   Oral  SpO2: 100%  100% 99%  Weight:  113.1 kg    Height:  6\' 3"  (1.905 m)        Final Clinical Impression(s) / ED Diagnoses Final diagnoses:  Facial injury, initial encounter  Facial laceration, initial encounter    Rx / DC Orders ED Discharge Orders         Ordered    HYDROcodone-acetaminophen (NORCO/VICODIN) 5-325 MG tablet  Every 6 hours PRN     Discontinue  Reprint     08/31/19 0038           Lynn Recendiz, Hillard DankerShawn C, PA-C 09/03/19 0034    Vanetta MuldersZackowski, Scott, MD 09/03/19 1520
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
## 2019-08-31 ENCOUNTER — Telehealth: Payer: Self-pay | Admitting: *Deleted

## 2019-08-31 MED ORDER — HYDROCODONE-ACETAMINOPHEN 5-325 MG PO TABS
1.0000 | ORAL_TABLET | Freq: Four times a day (QID) | ORAL | 0 refills | Status: AC | PRN
Start: 2019-08-31 — End: ?

## 2019-08-31 NOTE — Telephone Encounter (Signed)
Pharmacy called regarding diagnosis for pt prescribed vicodin.  RNCM reviewed the chart to find pt documented dx was facial laceration.  Pharmacy accepted dx and filled Rx as written.
